# Patient Record
Sex: Male | Born: 1980 | Race: Black or African American | Hispanic: No | Marital: Single | Smoking: Never smoker
Health system: Southern US, Community
[De-identification: ages and names within clinical notes are randomized; demographics above are authoritative.]

## PROBLEM LIST (undated history)

## (undated) DIAGNOSIS — Z8679 Personal history of other diseases of the circulatory system: Secondary | ICD-10-CM

## (undated) HISTORY — DX: Personal history of other diseases of the circulatory system: Z86.79

## (undated) HISTORY — PX: TOTAL HIP ARTHROPLASTY: SHX124

---

## 1999-11-26 ENCOUNTER — Emergency Department (HOSPITAL_COMMUNITY): Admission: EM | Admit: 1999-11-26 | Discharge: 1999-11-26 | Payer: Self-pay | Admitting: *Deleted

## 2001-08-14 ENCOUNTER — Emergency Department (HOSPITAL_COMMUNITY): Admission: EM | Admit: 2001-08-14 | Discharge: 2001-08-15 | Payer: Self-pay | Admitting: *Deleted

## 2001-08-14 ENCOUNTER — Encounter: Payer: Self-pay | Admitting: *Deleted

## 2001-08-15 ENCOUNTER — Ambulatory Visit (HOSPITAL_BASED_OUTPATIENT_CLINIC_OR_DEPARTMENT_OTHER): Admission: RE | Admit: 2001-08-15 | Discharge: 2001-08-15 | Payer: Self-pay | Admitting: Orthopedic Surgery

## 2003-01-06 ENCOUNTER — Emergency Department (HOSPITAL_COMMUNITY): Admission: EM | Admit: 2003-01-06 | Discharge: 2003-01-06 | Payer: Self-pay | Admitting: Emergency Medicine

## 2003-01-06 ENCOUNTER — Encounter: Payer: Self-pay | Admitting: Emergency Medicine

## 2003-01-08 ENCOUNTER — Emergency Department (HOSPITAL_COMMUNITY): Admission: EM | Admit: 2003-01-08 | Discharge: 2003-01-08 | Payer: Self-pay | Admitting: Emergency Medicine

## 2005-02-26 ENCOUNTER — Emergency Department (HOSPITAL_COMMUNITY): Admission: EM | Admit: 2005-02-26 | Discharge: 2005-02-26 | Payer: Self-pay | Admitting: Emergency Medicine

## 2010-12-26 ENCOUNTER — Emergency Department: Payer: Self-pay | Admitting: Unknown Physician Specialty

## 2013-05-09 ENCOUNTER — Emergency Department: Payer: Self-pay | Admitting: Emergency Medicine

## 2014-06-26 IMAGING — CT CT CERVICAL SPINE WITHOUT CONTRAST
3 of 4 series · 12 of 33 positions shown, 14 images · non-contrast
Comparison: None.

CLINICAL DATA: Transient upper and lower extremity numbness post
trauma

EXAM:
CT CERVICAL SPINE WITHOUT CONTRAST
TECHNIQUE: Multidetector CT imaging of the cervical spine was performed without
intravenous contrast. Multiplanar CT image reconstructions were also
generated.

[Series 4: sag bone · sagittal · 0.23mm/px · 5 of 62 slices shown, 6 images]
[im 21/62  bone]
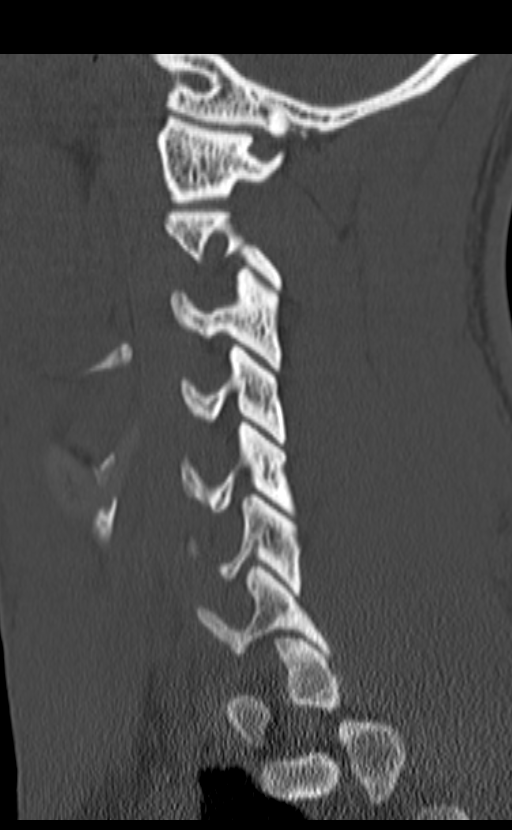
[im 26/62  bone]
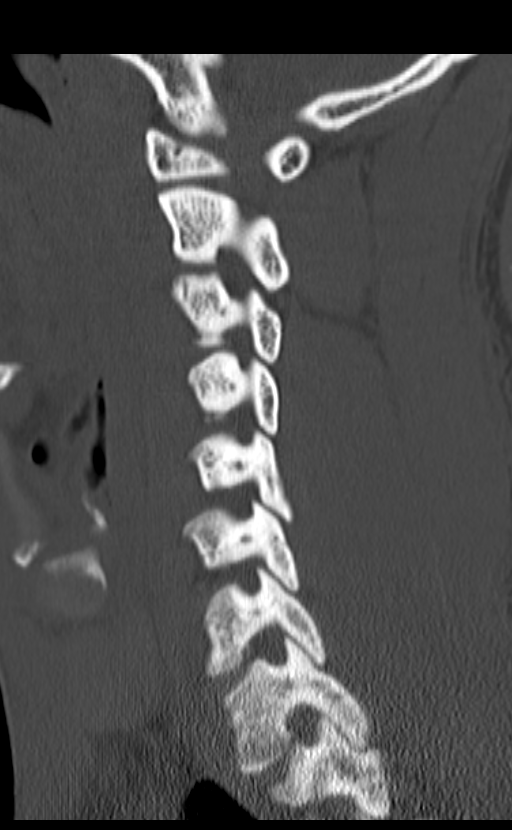
[im 31/62  soft-tissue]
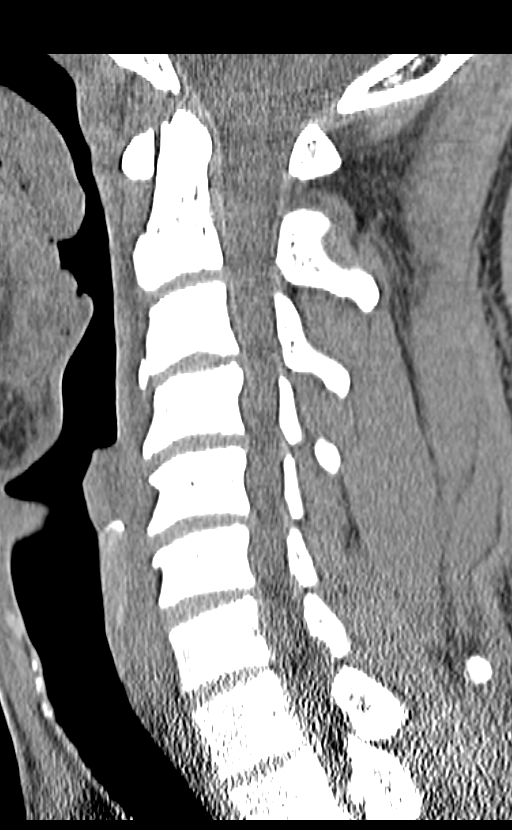
[im 31/62  bone]
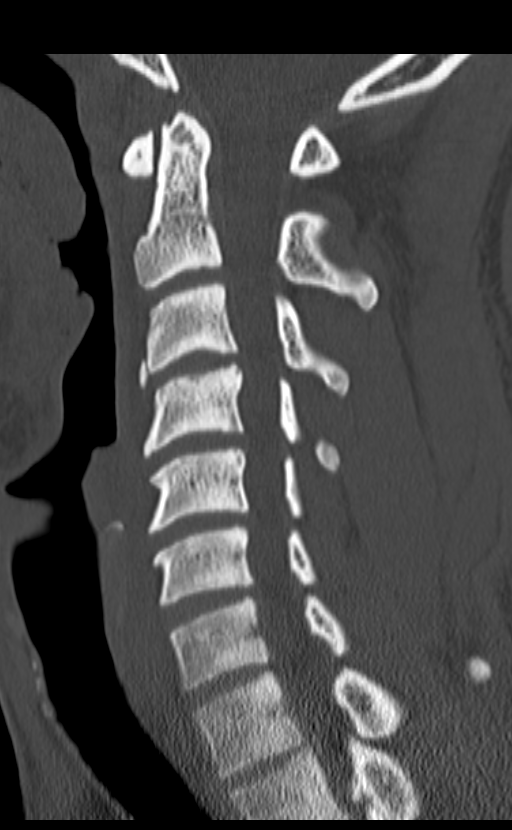
[im 36/62  bone]
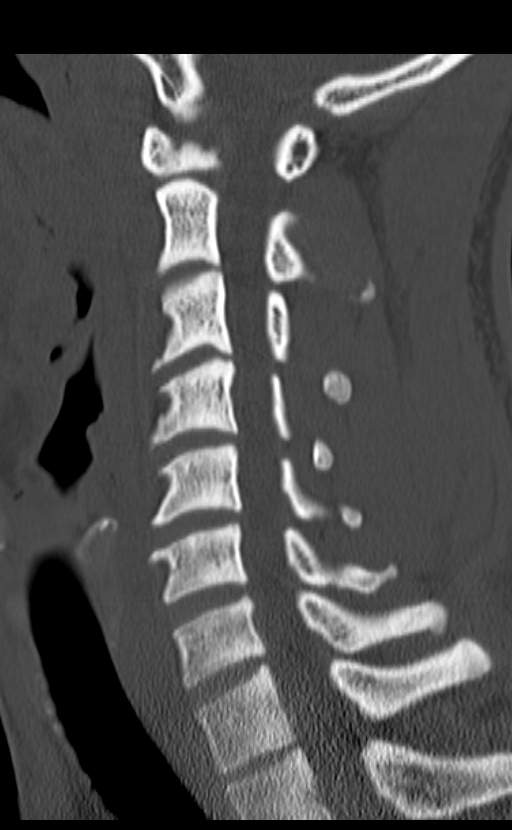
[im 41/62  bone]
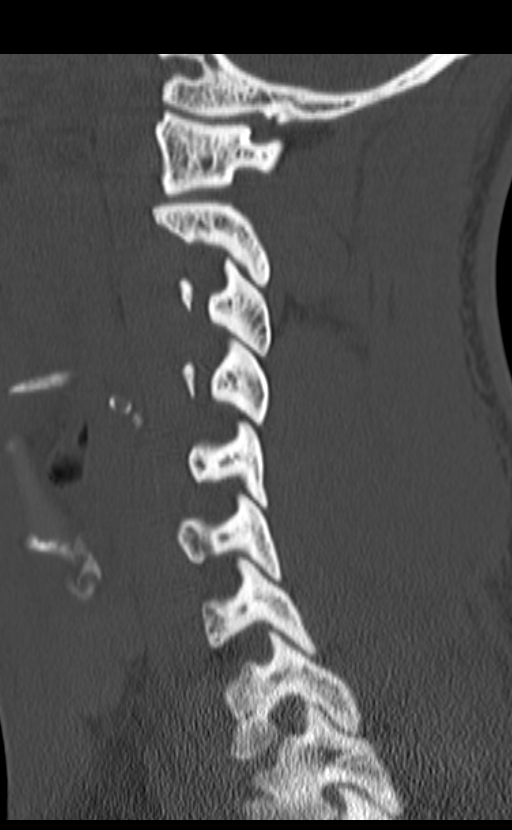

[Series 5: orthogonal axials · axial · 0.29mm/px · z∈[-219,-108]mm · 4 of 85 slices shown, 5 images]
[im 15/85  soft-tissue]
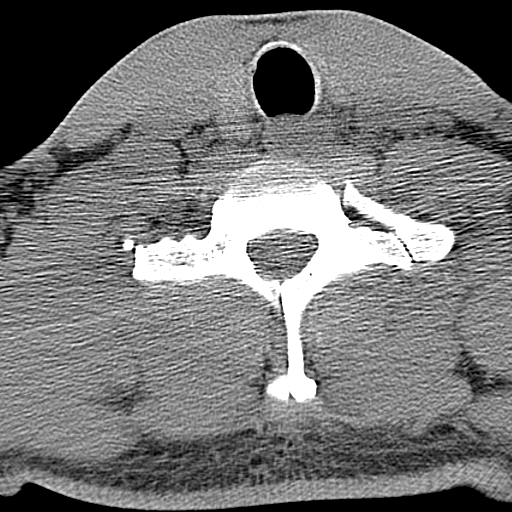
[im 15/85  bone]
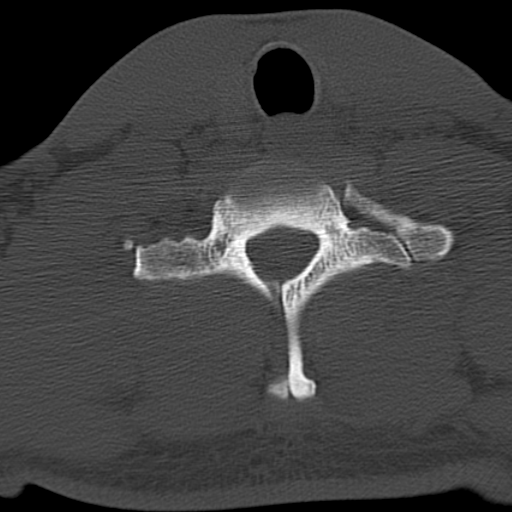
[im 29/85  bone]
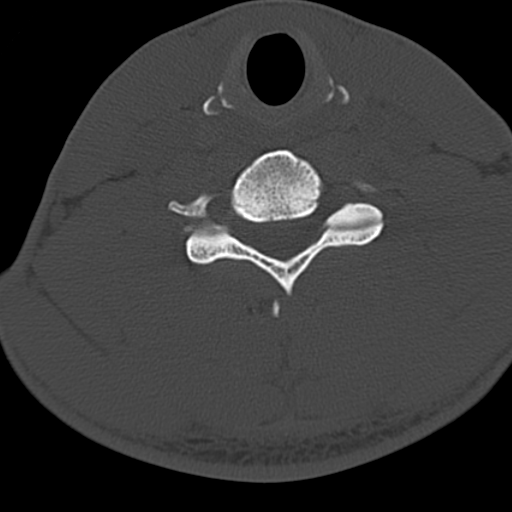
[im 57/85  bone]
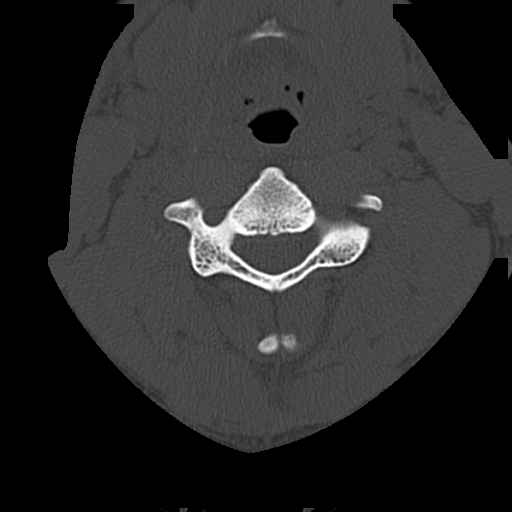
[im 71/85  bone]
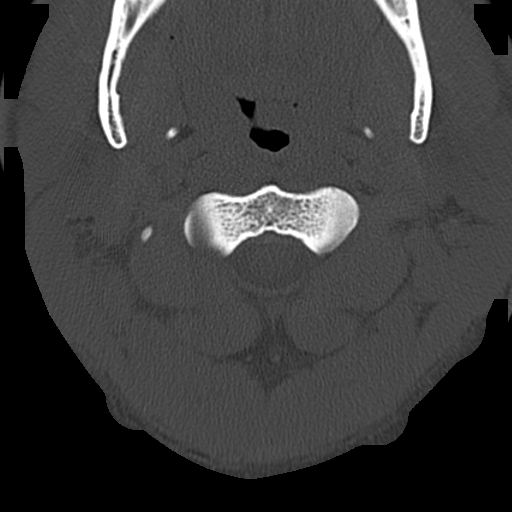

[Series 8: cor bone · coronal · 0.30mm/px · 3 of 53 slices shown]
[im 11/53  bone]
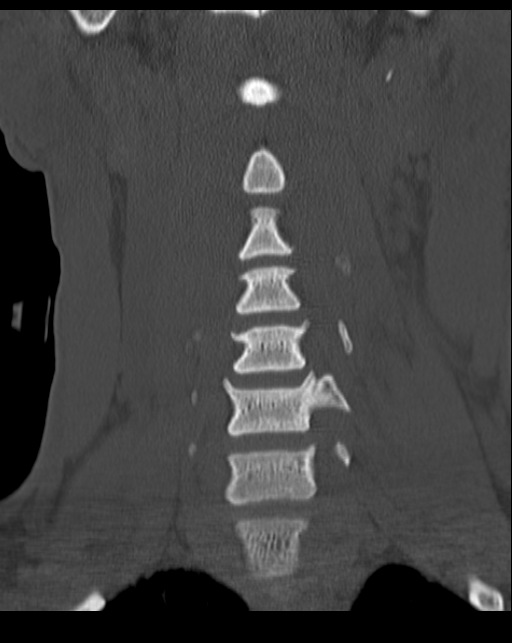
[im 21/53  bone]
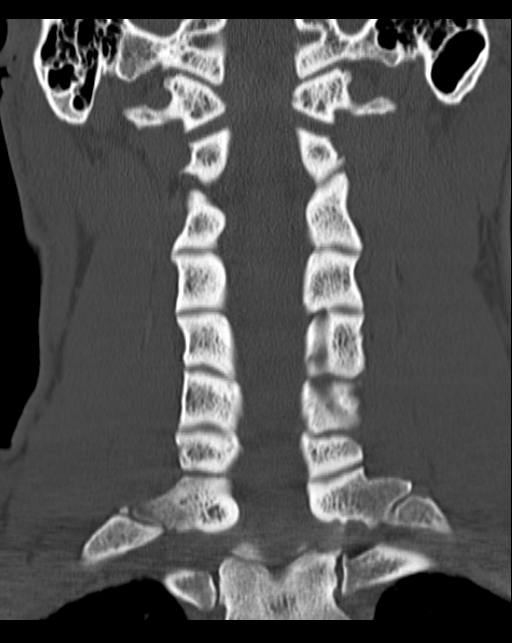
[im 32/53  bone]
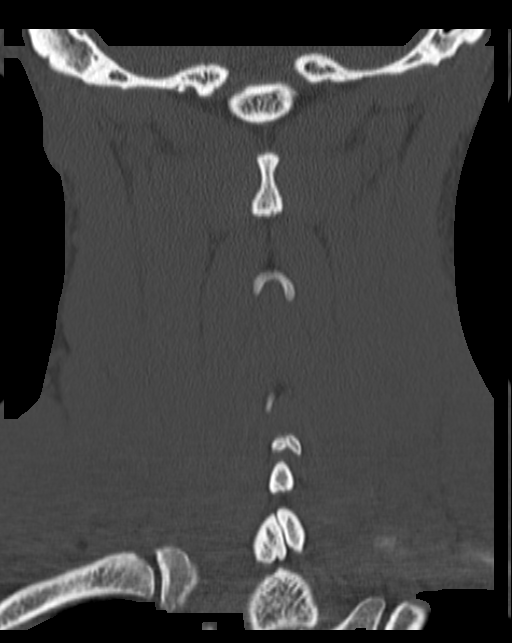

[12 of 33 positions shown; findings below may reference images not displayed]

FINDINGS: There is no fracture or spondylolisthesis. Prevertebral soft tissues
and predental space regions are normal.

Disc spaces appear intact. There are small anterior osteophytes at
C4, C5, and C6.

There is no apparent disc extrusion or stenosis. There is no obvious
paraspinous hematoma on this noncontrast enhanced study. No nerve
root edema or effacement apparent.
IMPRESSION: No fracture or spondylolisthesis. No apparent disc extrusion or
stenosis. No apparent paraspinous hematoma or obvious cervical or
upper thoracic cord lesion on this noncontrast enhanced study. It
should be noted that if there is concern for potential cord lesion
or paraspinous hematoma, MR is much more sensitive the noncontrast
enhanced CT for those entities.

## 2014-06-26 IMAGING — CT CT THORACIC SPINE WITHOUT CONTRAST
3 of 4 series · 12 of 33 positions shown, 14 images · non-contrast
Comparison: None.

CLINICAL DATA: Back pain.

EXAM:
CT THORACIC SPINE WITHOUT CONTRAST
TECHNIQUE: Multidetector CT imaging of the thoracic spine was performed without
intravenous contrast administration. Multiplanar CT image
reconstructions were also generated.

[Series 3: t spine bone · axial · 0.37mm/px · z∈[-358,-126]mm · 4 of 175 slices shown, 5 images]
[im 30/175  soft-tissue]
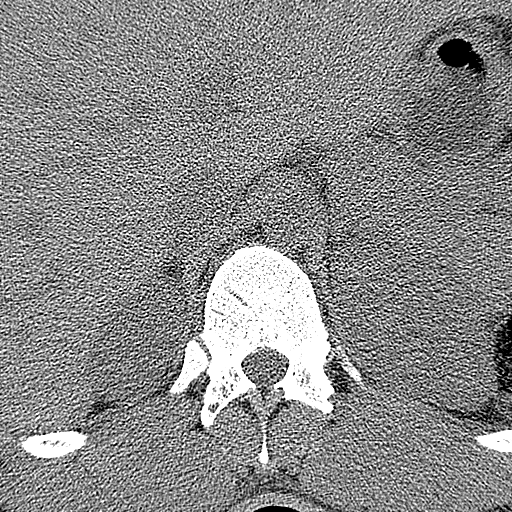
[im 30/175  bone]
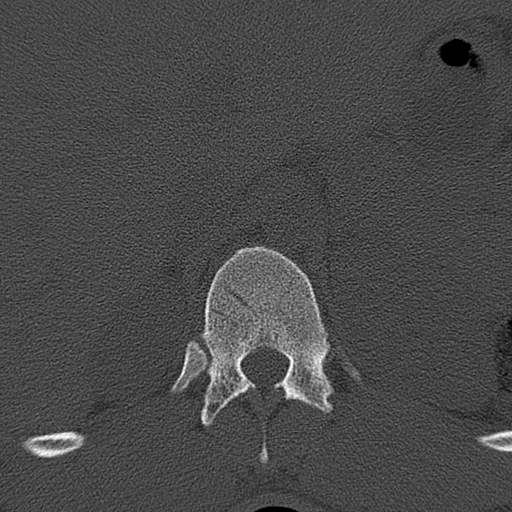
[im 59/175  bone]
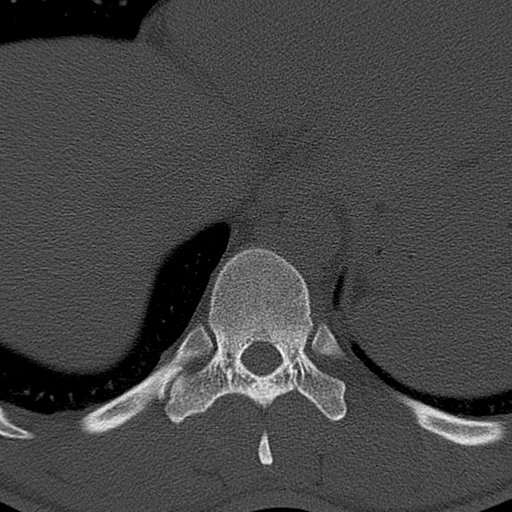
[im 117/175  bone]
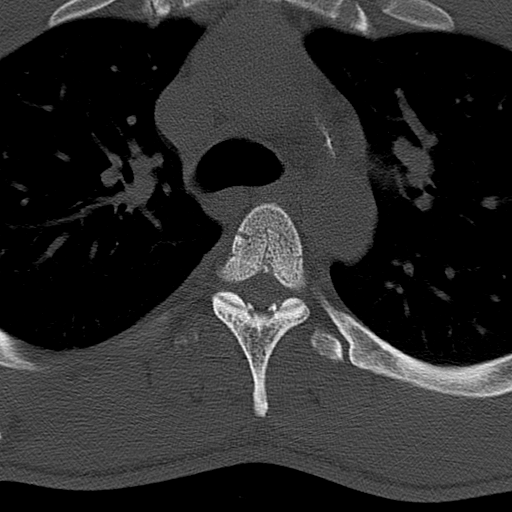
[im 146/175  bone]
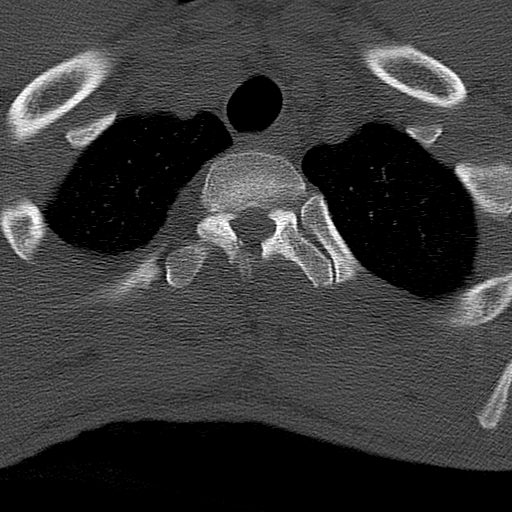

[Series 5: sagittal bone · sagittal · 0.44mm/px · 5 of 79 slices shown, 6 images]
[im 27/79  bone]
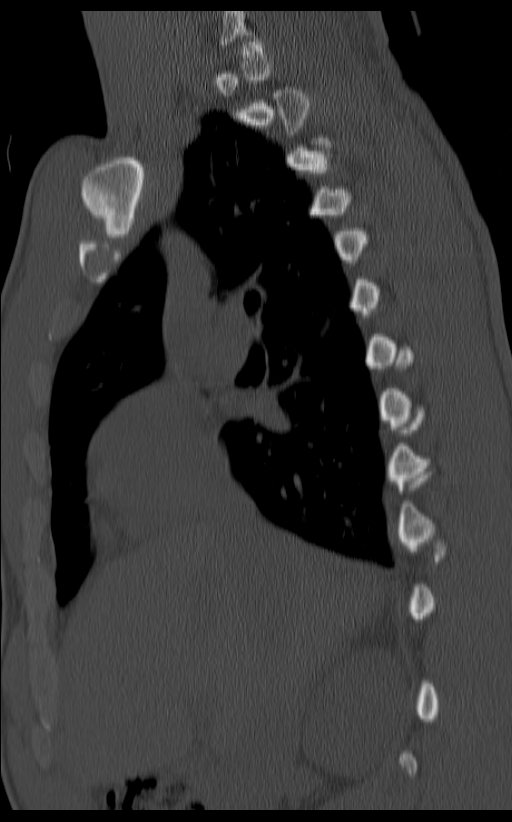
[im 33/79  bone]
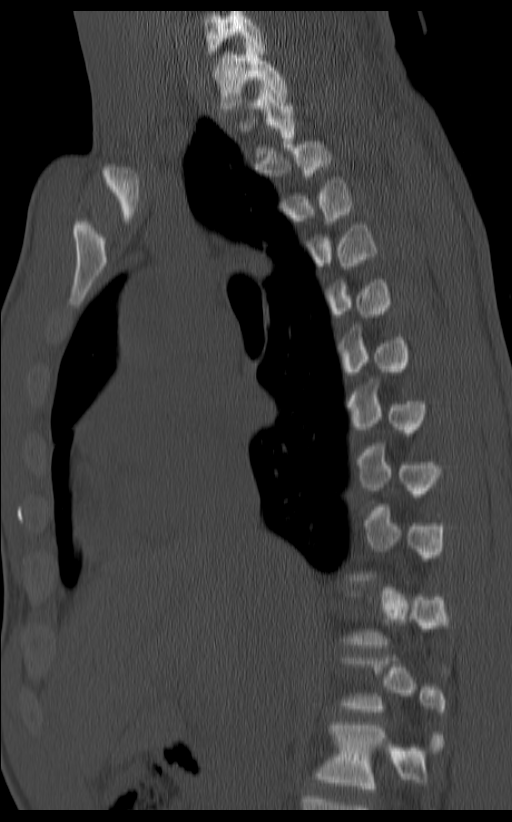
[im 40/79  soft-tissue]
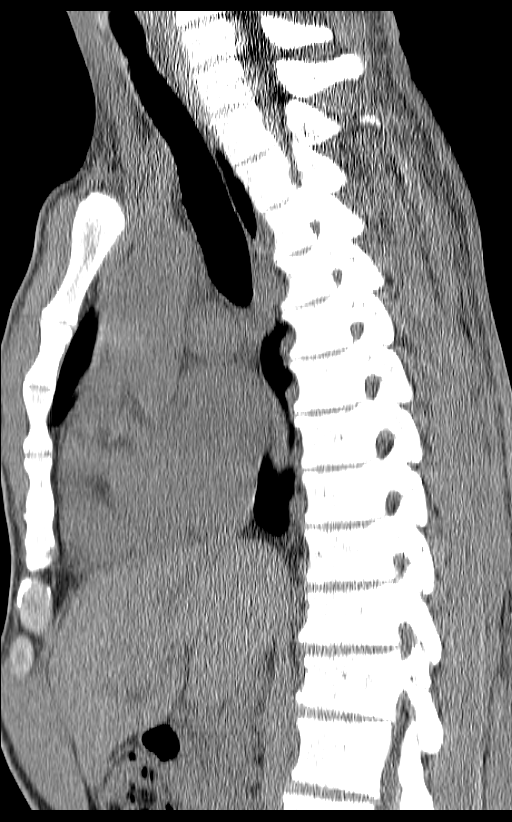
[im 40/79  bone]
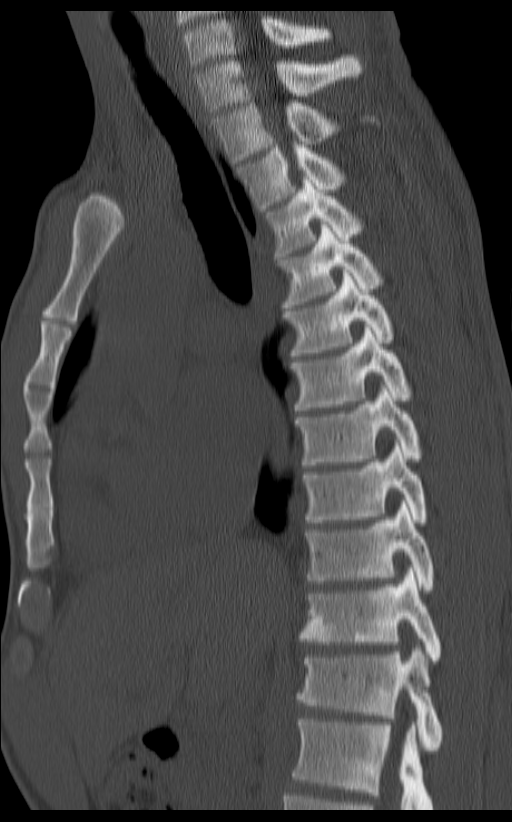
[im 46/79  bone]
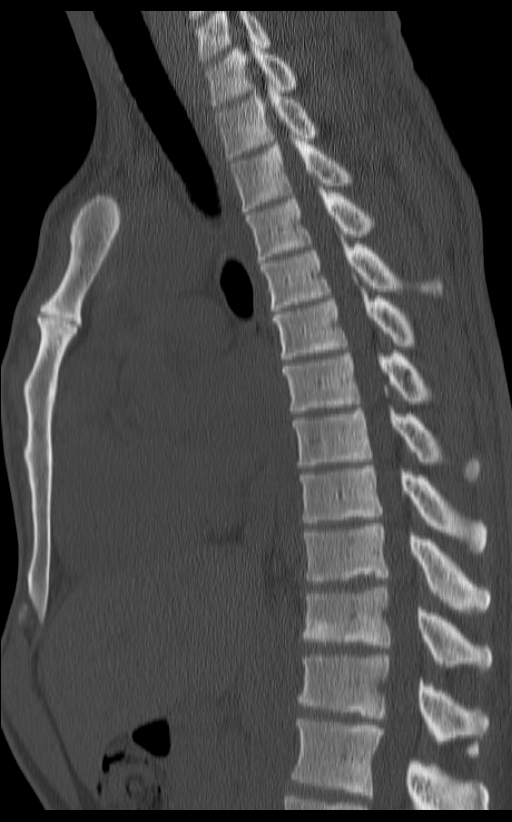
[im 53/79  bone]
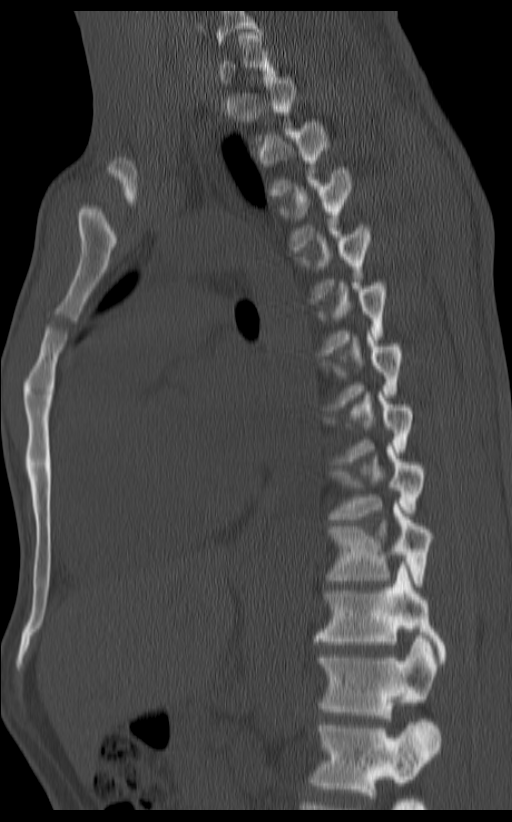

[Series 6: coronal bone · coronal · 0.44mm/px · 3 of 73 slices shown]
[im 15/73  bone]
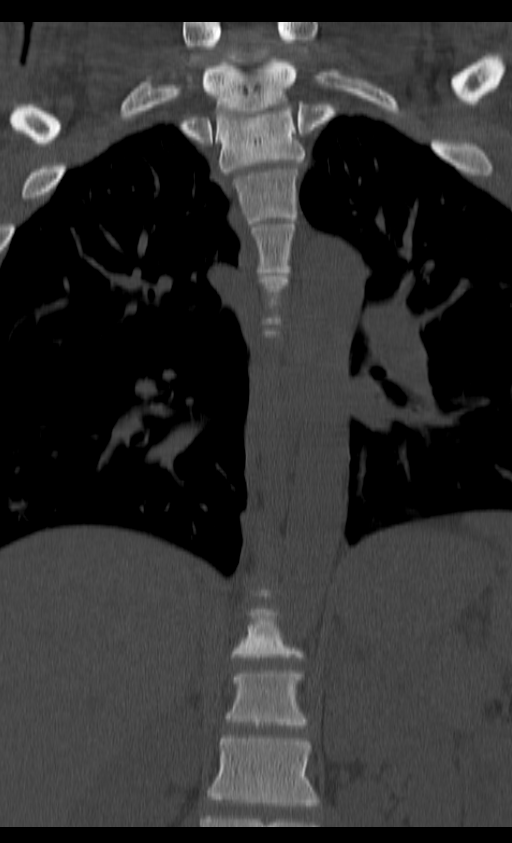
[im 29/73  bone]
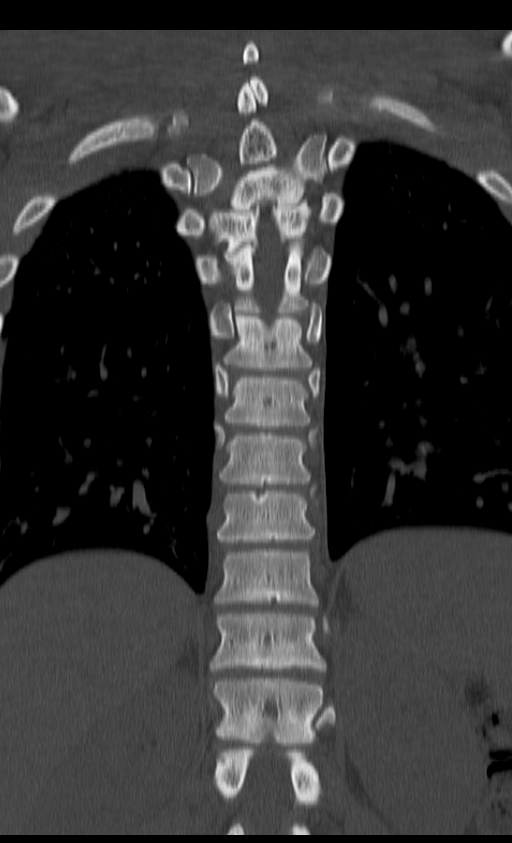
[im 44/73  bone]
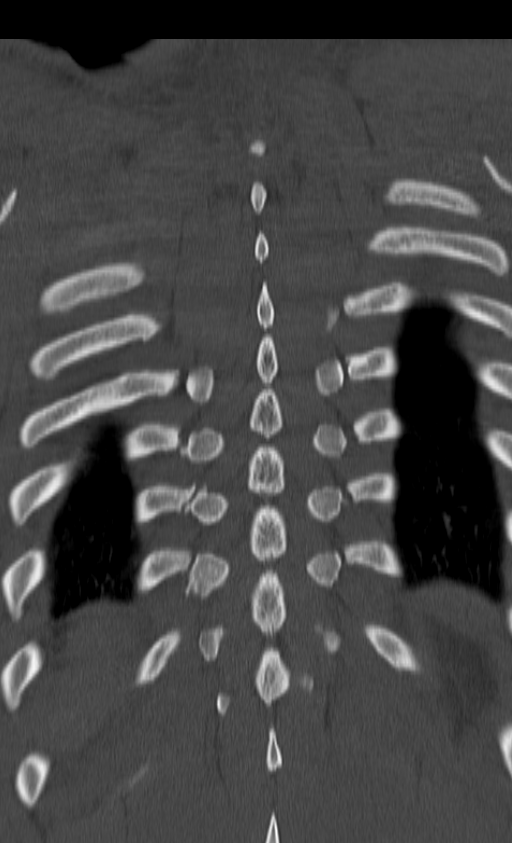

[12 of 33 positions shown; findings below may reference images not displayed]

FINDINGS: There is no evidence of acute fracture, dislocation, or
malalignment. There is no evidence of canal stenosis. Evaluation of
the spine of T1 demonstrates a linear lucency extending along the
spine. The borders of this lucency are corticated, this finding has
the appearance of congenital non fusion. Sequela of chronic fracture
cannot be excluded. There is no evidence of acute fracture.
Degenerative disc disease changes are appreciated within the lower
lumbar spine. There are areas of Modic endplate changes and mild
disc space narrowing.
IMPRESSION: No evidence of acute osseous abnormalities. Chronic versus
congenital changes involving the spine of T1. Early or mild
degenerative disc disease changes identified within the lower
thoracic spine.

## 2015-04-07 ENCOUNTER — Emergency Department
Admission: EM | Admit: 2015-04-07 | Discharge: 2015-04-07 | Disposition: A | Discharge status: Home / Self Care | Payer: BLUE CROSS/BLUE SHIELD | Attending: Emergency Medicine | Admitting: Emergency Medicine

## 2015-04-07 ENCOUNTER — Encounter: Payer: Self-pay | Admitting: Emergency Medicine

## 2015-04-07 DIAGNOSIS — X58XXXA Exposure to other specified factors, initial encounter: Secondary | ICD-10-CM | POA: Diagnosis not present

## 2015-04-07 DIAGNOSIS — S299XXA Unspecified injury of thorax, initial encounter: Secondary | ICD-10-CM | POA: Diagnosis present

## 2015-04-07 DIAGNOSIS — Y998 Other external cause status: Secondary | ICD-10-CM | POA: Insufficient documentation

## 2015-04-07 DIAGNOSIS — Y92310 Basketball court as the place of occurrence of the external cause: Secondary | ICD-10-CM | POA: Diagnosis not present

## 2015-04-07 DIAGNOSIS — S29012A Strain of muscle and tendon of back wall of thorax, initial encounter: Secondary | ICD-10-CM | POA: Insufficient documentation

## 2015-04-07 DIAGNOSIS — Y9367 Activity, basketball: Secondary | ICD-10-CM | POA: Insufficient documentation

## 2015-04-07 DIAGNOSIS — Z87891 Personal history of nicotine dependence: Secondary | ICD-10-CM | POA: Insufficient documentation

## 2015-04-07 MED ORDER — HYDROMORPHONE HCL 1 MG/ML IJ SOLN
1.0000 mg | Freq: Once | INTRAMUSCULAR | Status: AC
  Administered 2015-04-07: 1 mg via INTRAMUSCULAR
  Filled 2015-04-07: qty 1

## 2015-04-07 MED ORDER — ORPHENADRINE CITRATE 30 MG/ML IJ SOLN
60.0000 mg | Freq: Two times a day (BID) | INTRAMUSCULAR | Status: DC
  Administered 2015-04-07: 60 mg via INTRAMUSCULAR
  Filled 2015-04-07: qty 2

## 2015-04-07 MED ORDER — KETOROLAC TROMETHAMINE 60 MG/2ML IM SOLN
60.0000 mg | Freq: Once | INTRAMUSCULAR | Status: AC
Start: 2015-04-07 — End: 2015-04-07
  Administered 2015-04-07: 60 mg via INTRAMUSCULAR
  Filled 2015-04-07: qty 2

## 2015-04-07 MED ORDER — CYCLOBENZAPRINE HCL 10 MG PO TABS: 10.0000 mg | ORAL_TABLET | Freq: Three times a day (TID) | ORAL | Status: DC | PRN

## 2015-04-07 MED ORDER — IBUPROFEN 800 MG PO TABS: 800.0000 mg | ORAL_TABLET | Freq: Three times a day (TID) | ORAL | Status: DC | PRN

## 2015-04-07 MED ORDER — TRAMADOL HCL 50 MG PO TABS: 50.0000 mg | ORAL_TABLET | Freq: Four times a day (QID) | ORAL | Status: DC | PRN

## 2015-04-07 NOTE — ED Notes (Signed)
Having pain to mid back after playing b/b yesterday   Ambulates well to treatment room  Pain is non radiating

## 2015-04-07 NOTE — ED Provider Notes (Signed)
[facility] Emergency Department Provider Note  ____________________________________________  Time seen: Approximately 1:01 PM  I have reviewed the triage vital signs and the nursing notes.   HISTORY  Chief Complaint Back Pain    HPI Jesus Hunt is a 34 y.o. male patient complain of mid and upper back pain secondary to a basketball yesterday. Patient state he went up for a rebound and landed and felt a jolting his back. Patient continued to to play basketball since it was near the end of the game. Patient say done the night he is developed mid upper back pain which seems to increase with movement of the upper extremities. He denies any loss of sensation or loss of strength. Patient stated the pain was not relieved taking over-the-counter BC powder which normally controls his pain.Patient is rating his pain as a 9/10. Describes the pain as spasmodic and sharp.   History reviewed. No pertinent past medical history.  There are no active problems to display for this patient.   History reviewed. No pertinent past surgical history.  Current Outpatient Rx  Name  Route  Sig  Dispense  Refill  . cyclobenzaprine (FLEXERIL) 10 MG tablet   Oral   Take 1 tablet (10 mg total) by mouth every 8 (eight) hours as needed for muscle spasms.   15 tablet   0   . ibuprofen (ADVIL,MOTRIN) 800 MG tablet   Oral   Take 1 tablet (800 mg total) by mouth every 8 (eight) hours as needed.   30 tablet   0   . traMADol (ULTRAM) 50 MG tablet   Oral   Take 1 tablet (50 mg total) by mouth every 6 (six) hours as needed for moderate pain.   12 tablet   0     Allergies Review of patient's allergies indicates no known allergies.  No family history on file.  Social History Social History  Substance Use Topics  . Smoking status: Former Games developer  . Smokeless tobacco: Never Used  . Alcohol Use: Yes     Comment: socially    Review of Systems Constitutional: No  fever/chills Eyes: No visual changes. ENT: No sore throat. Cardiovascular: Denies chest pain. Respiratory: Denies shortness of breath. Gastrointestinal: No abdominal pain.  No nausea, no vomiting.  No diarrhea.  No constipation. Genitourinary: Negative for dysuria. Musculoskeletal: Upper back pain Skin: Negative for rash. Neurological: Negative for headaches, focal weakness or numbness. 10-point ROS otherwise negative.  ____________________________________________   PHYSICAL EXAM:  VITAL SIGNS: ED Triage Vitals  Enc Vitals Group     BP 04/07/15 1203 127/84 mmHg     Pulse Rate 04/07/15 1203 73     Resp 04/07/15 1203 18     Temp 04/07/15 1203 98.6 F (37 C)     Temp Source 04/07/15 1203 Oral     SpO2 04/07/15 1203 100 %     Weight 04/07/15 1203 250 lb (113.399 kg)     Height 04/07/15 1203  (1.905 m)     Head Cir --      Peak Flow --      Pain Score 04/07/15 1205 9     Pain Loc --      Pain Edu? --      Excl. in GC? --     Constitutional: Alert and oriented. Well appearing and in no acute distress. Eyes: Conjunctivae are normal. PERRL. EOMI. Head: Atraumatic. Nose: No congestion/rhinnorhea. Mouth/Throat: Mucous membranes are moist.  Oropharynx non-erythematous. Neck: No stridor.  No cervical spine tenderness to palpation. Hematological/Lymphatic/Immunilogical: No cervical lymphadenopathy. Cardiovascular: Normal rate, regular rhythm. Grossly normal heart sounds.  Good peripheral circulation. Respiratory: Normal respiratory effort.  No retractions. Lungs CTAB. Gastrointestinal: Soft and nontender. No distention. No abdominal bruits. No CVA tenderness. Musculoskeletal: No spinal deformity. Tender palpation bilateral inferior scapular area. Patient has full range of motion upper extremities to rates of pain. Patient's strength upper extremity confuses since is 5/5.   Neurologic:  Normal speech and language. No gross focal neurologic deficits are appreciated. No gait  instability. Skin:  Skin is warm, dry and intact. No rash noted. Psychiatric: Mood and affect are normal. Speech and behavior are normal.  ____________________________________________   LABS (all labs ordered are listed, but only abnormal results are displayed)  Labs Reviewed - No data to display ____________________________________________  EKG   ____________________________________________  RADIOLOGY   ____________________________________________   PROCEDURES  Procedure(s) performed: None  Critical Care performed: No  ____________________________________________   INITIAL IMPRESSION / ASSESSMENT AND PLAN / ED COURSE  Pertinent labs & imaging results that were available during my care of the patient were reviewed by me and considered in my medical decision making (see chart for details).  Upper back strain. Patient discharged prescription for Flexeril, ibuprofen, and tramadol. Patient given discharge instructions on muscle strain. Patient advised to follow-up with "clinic if no improvement with this complaint in 3-5 days. ____________________________________________   FINAL CLINICAL IMPRESSION(S) / ED DIAGNOSES  Final diagnoses:  Upper back strain, initial encounter      Joni ReiningRonald K Doylene Splinter, PA-C 04/07/15 1308  Sharman CheekPhillip Stafford, MD 04/08/15 1521

## 2015-04-07 NOTE — ED Notes (Signed)
C/O mid back pain.  States injury back yesterday while playing basketball.  Has taken some BC's for pain, with no effect.  C/o back pain, worse with cough, deep breathing, movement.

## 2015-04-07 NOTE — Discharge Instructions (Signed)
Muscle Strain °A muscle strain (pulled muscle) happens when a muscle is stretched beyond normal length. It happens when a sudden, violent force stretches your muscle too far. Usually, a few of the fibers in your muscle are torn. Muscle strain is common in athletes. Recovery usually takes 1-2 weeks. Complete healing takes 5-6 weeks.  °HOME CARE  °· Follow the PRICE method of treatment to help your injury get better. Do this the first 2-3 days after the injury: °¨ Protect. Protect the muscle to keep it from getting injured again. °¨ Rest. Limit your activity and rest the injured body part. °¨ Ice. Put ice in a plastic bag. Place a towel between your skin and the bag. Then, apply the ice and leave it on from 15-20 minutes each hour. After the third day, switch to moist heat packs. °¨ Compression. Use a splint or elastic bandage on the injured area for comfort. Do not put it on too tightly. °¨ Elevate. Keep the injured body part above the level of your heart. °· Only take medicine as told by your doctor. °· Warm up before doing exercise to prevent future muscle strains. °GET HELP IF:  °· You have more pain or puffiness (swelling) in the injured area. °· You feel numbness, tingling, or notice a loss of strength in the injured area. °MAKE SURE YOU:  °· Understand these instructions. °· Will watch your condition. °· Will get help right away if you are not doing well or get worse. °  °This information is not intended to replace advice given to you by your health care provider. Make sure you discuss any questions you have with your health care provider. °  °Document Released: 02/24/2008 Document Revised: 03/07/2013 Document Reviewed: 12/14/2012 °Elsevier Interactive Patient Education ©2016 [employer] ° °

## 2023-05-18 ENCOUNTER — Ambulatory Visit: Payer: BLUE CROSS/BLUE SHIELD | Admitting: Family

## 2023-05-19 ENCOUNTER — Encounter: Payer: Self-pay | Admitting: Family Medicine

## 2023-05-19 ENCOUNTER — Ambulatory Visit: Payer: Self-pay | Admitting: Family Medicine

## 2023-05-19 VITALS — BP 116/77 | HR 58 | Temp 98.3°F | Resp 18 | Ht 75.0 in | Wt 287.5 lb

## 2023-05-19 DIAGNOSIS — Z7689 Persons encountering health services in other specified circumstances: Secondary | ICD-10-CM

## 2023-05-19 DIAGNOSIS — Z8679 Personal history of other diseases of the circulatory system: Secondary | ICD-10-CM | POA: Insufficient documentation

## 2023-05-19 NOTE — Assessment & Plan Note (Addendum)
History of murmur as infant. No symptoms with exercise. No murmur appreciated during physical exam today while sitting, lying, and squatting.  Excellent exercise endurance without chest pain, dyspnea, syncope. Jogs 1.5 miles daily.  Paperwork completed for new job.

## 2023-05-19 NOTE — Progress Notes (Signed)
New Patient Office Visit  Subjective    Patient ID: Jesus Hunt, male    DOB: 1980-09-19  Age: 42 y.o. MRN: [id number]  CC:  Chief Complaint  Patient presents with   Establish Care    Patient is here to establish care with new PCP, patient is here with concerns of history of heart murmur    HPI Jesus Hunt presents to establish care with this practice. He is new to me. Presents today for concerns of history of murmur as infant.He was getting an occupational health physical for his new job and because he had a history of murmur as an infant, the provider needed him to see PCP.    Does not endorse issues with murmur. He has been active in sports since high school and college without any issues. Continues to exercise with good endurance and absent of cardiac symptoms.  Recent hip surgery, able to jog 1.5 miles without chest pain, shortness of breath or syncope.  Denies history of syncope ever.  Played basketball in college without issues.  Played football in high school and basketball without issues.   History reviewed. Not currently taking medications. Not currently being treated by specialist.    Outpatient Encounter Medications as of 05/19/2023  Medication Sig   [DISCONTINUED] cyclobenzaprine (FLEXERIL) 10 MG tablet Take 1 tablet (10 mg total) by mouth every 8 (eight) hours as needed for muscle spasms.   [DISCONTINUED] ibuprofen (ADVIL,MOTRIN) 800 MG tablet Take 1 tablet (800 mg total) by mouth every 8 (eight) hours as needed.   [DISCONTINUED] traMADol (ULTRAM) 50 MG tablet Take 1 tablet (50 mg total) by mouth every 6 (six) hours as needed for moderate pain.   No facility-administered encounter medications on file as of 05/19/2023.    History reviewed. No pertinent past medical history.  Past Surgical History:  Procedure Laterality Date   TOTAL HIP ARTHROPLASTY Left     History reviewed. No pertinent family history.  Social History   Socioeconomic  History   Marital status: Single    Spouse name: Not on file   Number of children: 2   Years of education: Not on file   Highest education level: Not on file  Occupational History   Not on file  Tobacco Use   Smoking status: Never    Passive exposure: Never   Smokeless tobacco: Never  Vaping Use   Vaping status: Never Used  Substance and Sexual Activity   Alcohol use: Yes    Comment: socially   Drug use: No   Sexual activity: Yes  Other Topics Concern   Not on file  Social History Narrative   Not on file   Social Drivers of Health   Financial Resource Strain: Not on file  Food Insecurity: Not on file  Transportation Needs: Not on file  Physical Activity: Not on file  Stress: Not on file  Social Connections: Not on file  Intimate Partner Violence: Not on file    Review of Systems  Respiratory:  Negative for shortness of breath (exercise endurance excellent.).   Cardiovascular:  Negative for chest pain, palpitations, orthopnea and leg swelling.        Objective    BP 116/77   Pulse (!) 58   Temp 98.3 F (36.8 C) (Oral)   Resp 18   Ht 6\' 3"  (1.905 m)   Wt 287 lb 8 oz (130.4 kg)   SpO2 97%   BMI 35.94 kg/m   Physical Exam Vitals and nursing  note reviewed.  Constitutional:      General: He is not in acute distress.    Appearance: Normal appearance.  Cardiovascular:     Rate and Rhythm: Normal rate and regular rhythm.     Heart sounds: Normal heart sounds. No murmur heard. Pulmonary:     Effort: Pulmonary effort is normal.     Breath sounds: Normal breath sounds.  Skin:    General: Skin is warm and dry.  Neurological:     General: No focal deficit present.     Mental Status: He is alert. Mental status is at baseline.  Psychiatric:        Mood and Affect: Mood normal.        Behavior: Behavior normal.        Thought Content: Thought content normal.        Judgment: Judgment normal.        Assessment & Plan:   Problem List Items Addressed  This Visit     Establishing care with new doctor, encounter for - Primary   Personal history of cardiac murmur   History of murmur as infant. No symptoms with exercise. No murmur appreciated during physical exam today while sitting, lying, and squatting.  Excellent exercise endurance without chest pain, dyspnea, syncope. Jogs 1.5 miles daily.  Paperwork completed for new job.      Agrees with plan of care discussed.  Questions answered.   Total time face to face: 30 minutes discussing history and exercise endurance.   Return in about 1 year (around 05/18/2024) for CPE with labs.   Novella Olive, FNP

## 2023-07-05 ENCOUNTER — Other Ambulatory Visit: Payer: Self-pay | Admitting: Family Medicine

## 2023-07-05 ENCOUNTER — Ambulatory Visit: Payer: Self-pay

## 2023-07-05 DIAGNOSIS — Z021 Encounter for pre-employment examination: Secondary | ICD-10-CM
